# Patient Record
Sex: Female | Born: 1975 | Race: White | Hispanic: No | Smoking: Never smoker
Health system: Southern US, Community
[De-identification: ages and names within clinical notes are randomized; demographics above are authoritative.]

## PROBLEM LIST (undated history)

## (undated) DIAGNOSIS — N200 Calculus of kidney: Secondary | ICD-10-CM

## (undated) DIAGNOSIS — M1990 Unspecified osteoarthritis, unspecified site: Secondary | ICD-10-CM

---

## 1999-09-30 ENCOUNTER — Other Ambulatory Visit: Admission: RE | Admit: 1999-09-30 | Discharge: 1999-09-30 | Payer: Self-pay | Admitting: Obstetrics & Gynecology

## 2001-03-07 ENCOUNTER — Other Ambulatory Visit: Admission: RE | Admit: 2001-03-07 | Discharge: 2001-03-07 | Payer: Self-pay | Admitting: Obstetrics & Gynecology

## 2002-04-27 ENCOUNTER — Other Ambulatory Visit: Admission: RE | Admit: 2002-04-27 | Discharge: 2002-04-27 | Payer: Self-pay | Admitting: Obstetrics & Gynecology

## 2003-06-24 ENCOUNTER — Other Ambulatory Visit: Admission: RE | Admit: 2003-06-24 | Discharge: 2003-06-24 | Payer: Self-pay | Admitting: Obstetrics & Gynecology

## 2004-08-07 ENCOUNTER — Other Ambulatory Visit: Admission: RE | Admit: 2004-08-07 | Discharge: 2004-08-07 | Payer: Self-pay | Admitting: Obstetrics & Gynecology

## 2005-01-31 ENCOUNTER — Encounter (INDEPENDENT_AMBULATORY_CARE_PROVIDER_SITE_OTHER): Payer: Self-pay | Admitting: *Deleted

## 2005-01-31 ENCOUNTER — Ambulatory Visit (HOSPITAL_COMMUNITY): Admission: AD | Admit: 2005-01-31 | Discharge: 2005-01-31 | Payer: Self-pay | Admitting: Obstetrics & Gynecology

## 2005-02-04 ENCOUNTER — Inpatient Hospital Stay (HOSPITAL_COMMUNITY): Admission: AD | Admit: 2005-02-04 | Discharge: 2005-02-04 | Payer: Self-pay | Admitting: Obstetrics & Gynecology

## 2006-02-02 ENCOUNTER — Inpatient Hospital Stay (HOSPITAL_COMMUNITY): Admission: RE | Admit: 2006-02-02 | Discharge: 2006-02-05 | Payer: Self-pay | Admitting: Obstetrics & Gynecology

## 2009-03-12 ENCOUNTER — Inpatient Hospital Stay (HOSPITAL_COMMUNITY): Admission: AD | Admit: 2009-03-12 | Discharge: 2009-03-12 | Payer: Self-pay | Admitting: Obstetrics and Gynecology

## 2009-05-14 ENCOUNTER — Inpatient Hospital Stay (HOSPITAL_COMMUNITY): Admission: RE | Admit: 2009-05-14 | Discharge: 2009-05-16 | Payer: Self-pay | Admitting: Obstetrics & Gynecology

## 2010-09-15 LAB — CBC
HCT: 31.9 % — ABNORMAL LOW (ref 36.0–46.0)
Hemoglobin: 11 g/dL — ABNORMAL LOW (ref 12.0–15.0)
MCV: 97.1 fL (ref 78.0–100.0)
Platelets: 199 10*3/uL (ref 150–400)
RBC: 3.27 MIL/uL — ABNORMAL LOW (ref 3.87–5.11)
RBC: 4.09 MIL/uL (ref 3.87–5.11)
RDW: 13.1 % (ref 11.5–15.5)
WBC: 10.3 10*3/uL (ref 4.0–10.5)
WBC: 10.6 10*3/uL — ABNORMAL HIGH (ref 4.0–10.5)

## 2010-09-15 LAB — RPR: RPR Ser Ql: NONREACTIVE

## 2010-09-18 LAB — URINALYSIS, ROUTINE W REFLEX MICROSCOPIC
Glucose, UA: 100 mg/dL — AB
Leukocytes, UA: NEGATIVE
Protein, ur: 30 mg/dL — AB
Specific Gravity, Urine: 1.01 (ref 1.005–1.030)
Urobilinogen, UA: 2 mg/dL — ABNORMAL HIGH (ref 0.0–1.0)

## 2010-09-18 LAB — URINE MICROSCOPIC-ADD ON

## 2010-09-18 LAB — CBC
HCT: 34.7 % — ABNORMAL LOW (ref 36.0–46.0)
MCV: 97.1 fL (ref 78.0–100.0)
Platelets: 202 10*3/uL (ref 150–400)
RDW: 12.9 % (ref 11.5–15.5)

## 2010-09-29 ENCOUNTER — Other Ambulatory Visit: Payer: Self-pay | Admitting: Obstetrics & Gynecology

## 2010-10-30 NOTE — Discharge Summary (Signed)
[name], Cassidy [name]           ACCOUNT NO.:  [id number]   MEDICAL RECORD NO.:  [id number]          PATIENT TYPE:  INP   LOCATION:  9105                          FACILITY:  [facility]   PHYSICIAN:  Carrington Clamp, M.D. DATE OF BIRTH:  09-Sep-1975   DATE OF ADMISSION:  02/02/2006  DATE OF DISCHARGE:  02/05/2006                                 DISCHARGE SUMMARY   FINAL DIAGNOSIS:  Intrauterine pregnancy at 40-4/[redacted] weeks gestation,  induction of labor, cephalopelvic disproportion, and persistent occiput  posterior position.   PROCEDURE:  Primary low transverse cesarean section.  Surgeon Dr. Ilda Mori.  Assistant Dr. Hilbert Bible.  Complications none.   This 35 year old G2, P 0-0-1-0, presents at term for induction with a  favorable cervix at 40 and 4/[redacted] weeks gestation.  The patient's antepartum  course up to this point had been uncomplicated. Upon admission and  induction, the patient progressed to complete dilation but failed to succeed  in bringing the vertex into the pelvis during the second stage of labor.  The fetus was noted to be in the occiput posterior presentation. Despite  efforts to rotate the infant, the baby remained in this position.  At this  point, a discussion was held with the patient on this failure to descend and  persistent malpresentation and a decision was made to proceed with a  cesarean section.  She was taken to the operating room by Dr. Ilda Mori  on February 03, 2006, where a primary low transverse cesarean section was  performed with delivery of an 8 pounds 5 ounces female infant without Apgars  of 8 and 9.  The delivery went without complications.  The patient's  postoperative course was benign without any significant fevers.  She was  felt ready for discharge on postoperative day three and was sent home on a  regular diet, told to decrease her activities, told to continue her prenatal  vitamins.  She did want her little boy circumcised before discharge.  She was  given Darvocet N100 1-2 q.4-6h. as needed for pain.  She was told to  continue her prenatal vitamins.  She is to follow up in our office in four  weeks.  Labs on discharge revealed the patient had a hemoglobin of 12.1,  white blood cell count of 20.1, and platelets of 218,000.      Leilani Able, P.A.-C.      Carrington Clamp, M.D.  Electronically Signed    MB/MEDQ  D:  03/03/2006  T:  03/04/2006  Job:  [id number]

## 2010-10-30 NOTE — Op Note (Signed)
[name], [name]           ACCOUNT NO.:  [id number]   MEDICAL RECORD NO.:  [id number]          PATIENT TYPE:  INP   LOCATION:  9105                          FACILITY:  [facility]   PHYSICIAN:  Ilda Mori, M.D.   DATE OF BIRTH:  11-17-1975   DATE OF PROCEDURE:  02/03/2006  DATE OF DISCHARGE:                                 OPERATIVE REPORT   PREOPERATIVE DIAGNOSIS:  Cephalopelvic disproportion and persistent occiput  posterior position.   POSTOPERATIVE DIAGNOSIS:  Cephalopelvic disproportion and persistent occiput  posterior position.   PROCEDURE:  Primary low transverse cesarean section.   SURGEON:  Dr. Ilda Mori.   ASSISTANT:  Dr. Hilbert Bible.   ANESTHESIA:  Epidural.   ESTIMATED BLOOD LOSS:  500 mL.   FINDINGS:  Female infant, Apgar scores 8 and 9, birth weight 8 pounds 5  ounces.   SPECIMENS:  Placenta was sent for cord blood collection.   COMPLICATIONS:  There were no complications.   INDICATIONS:  This is a 34 year old primigravida female who was admitted for  induction with a favorable cervix at 40 weeks and 3 days gestation.  The  patient progressed to complete dilatation, but failed to succeed in bringing  the vertex into the pelvis during the second stage.  The fetus was noted to  be in a occiput posterior presentation at 9 cm and despite efforts to rotate  the infant, the baby remained in an occiput posterior position for the last  part of the first stage and during the second stage of labor.  Based upon  the failure of descent and persistent occiput posterior position, the  decision was made to proceed with cesarean section.   PROCEDURE:  The patient was taken to the operating room and the epidural  anesthesia that had been placed during labor was injected for surgical  anesthesia.  The abdomen is prepped and draped in sterile fashion.  The  bladder had previously been catheterized.  A low transverse incision was  made and carried down to the fascia  which was entered transversely.  The  rectus sheath was divided from the underlying rectus muscle.  The rectus  muscle was then divided in the midline.  The peritoneum was entered by blunt  dissection and the serosa was entered sharply.  The lower segment was then  incised and carried down to the amniotic sac, which was then opened.  The  infant was delivered without difficulty.  Cord bloods were obtained.  The  placenta was then delivered intact and given for cord blood collection.  The  uterus was bluntly curettaged, the lower segment was closed with two layers,  first a running interlocking #1 Vicryl suture, the second a running  imbricating #1 Vicryl suture.  The peritoneum was closed in the  midline with a running 3-0 Vicryl suture that occluded the rectus muscle.  The fascia was closed with running 0 Vicryl suture and the skin was closed  with staples.  The patient tolerated the procedure well and left the  operative in good condition.      Ilda Mori, M.D.  Electronically Signed  RK/MEDQ  D:  02/03/2006  T:  02/03/2006  Job:  [id number]

## 2010-10-30 NOTE — Op Note (Signed)
Cassidy [name], Cassidy [name]           ACCOUNT NO.:  [id number]   MEDICAL RECORD NO.:  [id number]          PATIENT TYPE:  MAT   LOCATION:  MATC                          FACILITY:  [facility]   PHYSICIAN:  Gerrit Friends. Aldona Bar, M.D.   DATE OF BIRTH:  1976/04/20   DATE OF PROCEDURE:  01/31/2005  DATE OF DISCHARGE:                                 OPERATIVE REPORT   PREOPERATIVE DIAGNOSES:  1.  Incomplete abortion.  2.  Blood type O+.   POSTOPERATIVE DIAGNOSES:  1.  Incomplete abortion.  2.  Blood type O+.  3.  Pathology pending   PROCEDURE:  Suction dilatation and curettage for evacuation of incomplete  abortion.   SURGEON:  Gerrit Friends. Aldona Bar, M.D.   ANESTHESIA:  Intravenous conscious sedation plus paracervical block with 1%  Xylocaine without epinephrine.   HISTORY:  This 35 year old gravida 2, para 0 had a missed abortion diagnosed  in the office and was treated medically.  Her husband called me at 6:00 a.m.  on the morning of January 31, 2005,  relating the patient having hemorrhage  with a passing out spell.  She was advised to come to [facility] which  she did.  In triage she was having significant vaginal bleeding, although  her hemoglobin was 12.  She was taken to the operating room for evacuation  of incomplete abortion.   DESCRIPTION OF PROCEDURE:  Once in the operating room, the patient was  prepped and draped having placed in the short Allen stirrups in the  lithotomy position.  Intravenous conscious sedation was administered.  A  speculum was placed in the vagina after bladder had been drained of clear  urine with a red rubber catheter in an in-and-out fashion, and a large  amount of clot and blood was evacuated with suction.  The cervix was  visualized and was dilated beyond a #25 Pratt dilator.  There was a large  amount of what appeared to be the products of conception or clot at cervical  os.  This was removed but appeared to be more clot and products conception.  Using a  #8 suction curette, the cavity was then thoroughly gently and  systematically evacuated of what appeared to be a large amount of products  of conception.  Thereafter using the medium standard curette, the cavity was  thoroughly gently and systematically curetted until felt to be clean.  At  the conclusion of procedure, the uterus was upper limits of normal size, and  bleeding was minimal.  All products conception were sent pathology and  appropriately labeled.  The patient's blood type by history is O positive.   The procedure at this point was felt to be complete, and the patient was  taken to recovery room.  She will be discharged home with instructions to  return the office in approximately one weeks' time or as needed. She will be  given prescriptions for doxycycline 100 mg to use twice daily for five days  and Anaprox DS one every 8 hours as needed for cramping.  Condition on  arrival recovery is satisfactory.      Gerrit Friends.  Aldona Bar, M.D.  Electronically Signed     RMW/MEDQ  D:  01/31/2005  T:  01/31/2005  Job:  [id number]

## 2013-12-18 ENCOUNTER — Other Ambulatory Visit: Payer: Self-pay

## 2013-12-20 LAB — CYTOLOGY - PAP

## 2014-05-08 ENCOUNTER — Ambulatory Visit: Payer: Self-pay | Admitting: Podiatry

## 2014-05-08 ENCOUNTER — Ambulatory Visit (INDEPENDENT_AMBULATORY_CARE_PROVIDER_SITE_OTHER): Payer: 59 | Admitting: Podiatry

## 2014-05-08 ENCOUNTER — Encounter: Payer: Self-pay | Admitting: Podiatry

## 2014-05-08 VITALS — BP 118/78 | HR 87 | Resp 16 | Ht 63.0 in | Wt 112.0 lb

## 2014-05-08 DIAGNOSIS — L03039 Cellulitis of unspecified toe: Secondary | ICD-10-CM

## 2014-05-08 DIAGNOSIS — M2010 Hallux valgus (acquired), unspecified foot: Secondary | ICD-10-CM

## 2014-05-08 DIAGNOSIS — L03012 Cellulitis of left finger: Secondary | ICD-10-CM

## 2014-05-08 NOTE — Progress Notes (Signed)
   Subjective:    Patient ID: Pincus BadderJennifer B Lobb, female    DOB: 10/22/1975, 38 y.o.   MRN: [id number]  HPI Comments: i have a sad toe on my left foot great toe. It does hurt and i choose to ignore it. Its been like this for 2 days. Its better. i have been using epsom salt and i trim my nails.      Review of Systems  HENT: Positive for sinus pressure.   Skin:       Change in nails Thick scars  Hematological: Bruises/bleeds easily.  All other systems reviewed and are negative.      Objective:   Physical Exam        Assessment & Plan:

## 2014-05-08 NOTE — Patient Instructions (Signed)

## 2014-05-08 NOTE — Progress Notes (Signed)
Subjective:     Patient ID: Cassidy BadderJennifer B [name], female   DOB: 05/13/1976, 38 y.o.   MRN: [id number]  HPI patient has a painful left hallux nail mostly lateral border but across the entire nail and has lost the distal two thirds and secondary to trauma. States it's been this way for about a year's become painful recently with drainage also has structural deformity around the first metatarsal head of both feet which can become painful   Review of Systems  All other systems reviewed and are negative.      Objective:   Physical Exam  Constitutional: She is oriented to person, place, and time.  Cardiovascular: Intact distal pulses.   Musculoskeletal: Normal range of motion.  Neurological: She is oriented to person, place, and time.  Skin: Skin is warm.  Nursing note and vitals reviewed.  neurovascular status intact muscle strength adequate and range of motion within normal limits. Digits are well-perfused well oriented 3 in no equinus condition was noted. I noted that the left hallux nail is damaged with only one third present and the lateral side shows drainage and is painful when pressed     Assessment:     Damage left hallux nail with paronychia like infection present of the localized nature    Plan:     H&P and condition discussed with patient. I've recommended removal of nail and cleaning out of the bed and allowing a new nail to regrow explaining it may not grow normally. Patient wants procedure and today I infiltrated 60 g like Marcaine mixture remove the lateral border removed proud flesh abscess tissue and then remove remaining nail and applied sterile dressing. Reappoint for us to recheck again if nail should grow out abnormally

## 2014-06-14 HISTORY — PX: OTHER SURGICAL HISTORY: SHX169

## 2014-08-22 ENCOUNTER — Ambulatory Visit (INDEPENDENT_AMBULATORY_CARE_PROVIDER_SITE_OTHER): Payer: 59 | Admitting: Podiatry

## 2014-08-22 ENCOUNTER — Ambulatory Visit (INDEPENDENT_AMBULATORY_CARE_PROVIDER_SITE_OTHER): Payer: 59

## 2014-08-22 ENCOUNTER — Encounter: Payer: Self-pay | Admitting: Podiatry

## 2014-08-22 VITALS — BP 129/85 | HR 88 | Resp 16

## 2014-08-22 DIAGNOSIS — Q786 Multiple congenital exostoses: Secondary | ICD-10-CM

## 2014-08-22 DIAGNOSIS — L600 Ingrowing nail: Secondary | ICD-10-CM | POA: Diagnosis not present

## 2014-08-22 DIAGNOSIS — M79675 Pain in left toe(s): Secondary | ICD-10-CM

## 2014-08-22 DIAGNOSIS — M898X0 Other specified disorders of bone, multiple sites: Secondary | ICD-10-CM

## 2014-08-22 NOTE — Progress Notes (Signed)
Subjective:     Patient ID: Cassidy [name], female   DOB: 18-Nov-1975, 39 y.o.   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 At one point may require exostectomy or permanent nail removal and I educated her on both of those options

## 2015-02-18 ENCOUNTER — Other Ambulatory Visit: Payer: Self-pay | Admitting: Urology

## 2015-02-20 ENCOUNTER — Encounter (HOSPITAL_COMMUNITY): Payer: Self-pay | Admitting: *Deleted

## 2015-02-24 ENCOUNTER — Ambulatory Visit (HOSPITAL_COMMUNITY)
Admission: RE | Admit: 2015-02-24 | Discharge: 2015-02-24 | Disposition: A | Discharge status: Home / Self Care | Payer: Commercial Managed Care - HMO | Source: Ambulatory Visit | Attending: Urology | Admitting: Urology

## 2015-02-24 ENCOUNTER — Ambulatory Visit (HOSPITAL_COMMUNITY): Payer: Commercial Managed Care - HMO

## 2015-02-24 ENCOUNTER — Encounter (HOSPITAL_COMMUNITY): Payer: Self-pay | Admitting: *Deleted

## 2015-02-24 ENCOUNTER — Encounter (HOSPITAL_COMMUNITY)
Admission: RE | Disposition: A | Discharge status: Home / Self Care | Payer: Self-pay | Source: Ambulatory Visit | Attending: Urology

## 2015-02-24 DIAGNOSIS — Z87442 Personal history of urinary calculi: Secondary | ICD-10-CM | POA: Diagnosis not present

## 2015-02-24 DIAGNOSIS — Z7951 Long term (current) use of inhaled steroids: Secondary | ICD-10-CM | POA: Diagnosis not present

## 2015-02-24 DIAGNOSIS — Z793 Long term (current) use of hormonal contraceptives: Secondary | ICD-10-CM | POA: Diagnosis not present

## 2015-02-24 DIAGNOSIS — M7071 Other bursitis of hip, right hip: Secondary | ICD-10-CM | POA: Insufficient documentation

## 2015-02-24 DIAGNOSIS — M1990 Unspecified osteoarthritis, unspecified site: Secondary | ICD-10-CM | POA: Diagnosis not present

## 2015-02-24 DIAGNOSIS — N201 Calculus of ureter: Secondary | ICD-10-CM

## 2015-02-24 DIAGNOSIS — M7072 Other bursitis of hip, left hip: Secondary | ICD-10-CM | POA: Insufficient documentation

## 2015-02-24 DIAGNOSIS — Z79899 Other long term (current) drug therapy: Secondary | ICD-10-CM | POA: Diagnosis not present

## 2015-02-24 DIAGNOSIS — R109 Unspecified abdominal pain: Secondary | ICD-10-CM | POA: Diagnosis present

## 2015-02-24 HISTORY — DX: Unspecified osteoarthritis, unspecified site: M19.90

## 2015-02-24 HISTORY — DX: Calculus of kidney: N20.0

## 2015-02-24 LAB — PREGNANCY, URINE: Preg Test, Ur: NEGATIVE

## 2015-02-24 SURGERY — LITHOTRIPSY, ESWL
Anesthesia: LOCAL | Laterality: Right

## 2015-02-24 MED ORDER — DIPHENHYDRAMINE HCL 25 MG PO CAPS
25.0000 mg | ORAL_CAPSULE | ORAL | Status: AC
  Administered 2015-02-24: 25 mg via ORAL
  Filled 2015-02-24: qty 1

## 2015-02-24 MED ORDER — DIAZEPAM 5 MG PO TABS
10.0000 mg | ORAL_TABLET | ORAL | Status: AC
  Administered 2015-02-24: 10 mg via ORAL
  Filled 2015-02-24: qty 2

## 2015-02-24 MED ORDER — SODIUM CHLORIDE 0.9 % IV SOLN
INTRAVENOUS | Status: DC
  Administered 2015-02-24: 10:00:00 via INTRAVENOUS

## 2015-02-24 MED ORDER — CIPROFLOXACIN HCL 500 MG PO TABS
500.0000 mg | ORAL_TABLET | ORAL | Status: AC
  Administered 2015-02-24: 500 mg via ORAL
  Filled 2015-02-24: qty 1

## 2015-02-24 NOTE — Discharge Instructions (Signed)
Conscious Sedation, Adult, Care After °Refer to this sheet in the next few weeks. These instructions provide you with information on caring for yourself after your procedure. Your health care provider may also give you more specific instructions. Your treatment has been planned according to current medical practices, but problems sometimes occur. Call your health care provider if you have any problems or questions after your procedure. °WHAT TO EXPECT AFTER THE PROCEDURE  °After your procedure: °· You may feel sleepy, clumsy, and have poor balance for several hours. °· Vomiting may occur if you eat too soon after the procedure. °HOME CARE INSTRUCTIONS °· Do not participate in any activities where you could become injured for at least 24 hours. Do not: °¨ Drive. °¨ Swim. °¨ Ride a bicycle. °¨ Operate heavy machinery. °¨ Cook. °¨ Use power tools. °¨ Climb ladders. °¨ Work from a high place. °· Do not make important decisions or sign legal documents until you are improved. °· If you vomit, drink water, juice, or soup when you can drink without vomiting. Make sure you have little or no nausea before eating solid foods. °· Only take over-the-counter or prescription medicines for pain, discomfort, or fever as directed by your health care provider. °· Make sure you and your family fully understand everything about the medicines given to you, including what side effects may occur. °· You should not drink alcohol, take sleeping pills, or take medicines that cause drowsiness for at least 24 hours. °· If you smoke, do not smoke without supervision. °· If you are feeling better, you may resume normal activities 24 hours after you were sedated. °· Keep all appointments with your health care provider. °SEEK MEDICAL CARE IF: °· Your skin is pale or bluish in color. °· You continue to feel nauseous or vomit. °· Your pain is getting worse and is not helped by medicine. °· You have bleeding or swelling. °· You are still sleepy or  feeling clumsy after 24 hours. °SEEK IMMEDIATE MEDICAL CARE IF: °· You develop a rash. °· You have difficulty breathing. °· You develop any type of allergic problem. °· You have a fever. °MAKE SURE YOU: °· Understand these instructions. °· Will watch your condition. °· Will get help right away if you are not doing well or get worse. °Document Released: 03/21/2013 Document Reviewed: 03/21/2013 °[employer]® Patient Information ©2015 [employer]. This information is not intended to replace advice given to you by your health care provider. Make sure you discuss any questions you have with your health care provider. °  °

## 2015-02-24 NOTE — H&P (Signed)
Urology Admission H&P  Chief Complaint: right flank pain  History of Present Illness: Cassidy [name] is a 38yo with a hx of nephrolithiasis who presents today for R ESWL. This is her second stone event.  Her first was during pregnancy. 2 weeks ago she developed severe, sharp, intermittent, nonradiating right flank pain. She was diagnosed with a 7mm right ureteral stone. She had a KUB today which shows a mid to distal right ureteral stone. She denies fevers/chill/sweats  Past Medical History  Diagnosis Date  . Renal stones   . Arthritis     bursitis in both hips   Past Surgical History  Procedure Laterality Date  . Cesarean section  2007,2010  . Bone spur   2016    toe on left foot     Home Medications:  Prescriptions prior to admission  Medication Sig Dispense Refill Last Dose  . fluticasone (FLONASE) 50 MCG/ACT nasal spray Place 1 spray into both nostrils daily.   02/15/2015  . Multiple Vitamin (MULTIVITAMIN WITH MINERALS) TABS tablet Take 1 tablet by mouth daily.   02/23/2015  . norethindrone-ethinyl estradiol (CYCLAFEM,ALYACEN) 0.5/0.75/1-35 MG-MCG tablet Take 1 tablet by mouth daily.   02/24/2015 at 0600   Allergies:  Allergies  Allergen Reactions  . Anesthetics, Amide Nausea And Vomiting  . Other Nausea And Vomiting    All narcotics and mycins " make me throw up"  . Sulfa Antibiotics     bloating  . Latex Rash    History reviewed. No pertinent family history. Social History:  reports that she has never smoked. She does not have any smokeless tobacco history on file. She reports that she drinks alcohol. She reports that she does not use illicit drugs.  Review of Systems  Genitourinary: Positive for dysuria, urgency and flank pain.  All other systems reviewed and are negative.   Physical Exam:  Vital signs in last 24 hours: Temp:  [98.4 F (36.9 C)] 98.4 F (36.9 C) (09/12 0813) Pulse Rate:  [83] 83 (09/12 0813) Resp:  [16] 16 (09/12 0813) BP: (116)/(65) 116/65 mmHg  (09/12 0813) SpO2:  [97 %] 97 % (09/12 0813) Weight:  [50.984 kg (112 lb 6.4 oz)] 50.984 kg (112 lb 6.4 oz) (09/12 0813) Physical Exam  Constitutional: She is oriented to person, place, and time. She appears well-developed and well-nourished.  HENT:  Head: Normocephalic and atraumatic.  Eyes: EOM are normal.  Neck: Normal range of motion. No thyromegaly present.  Cardiovascular: Normal rate and regular rhythm.   Respiratory: Effort normal. No respiratory distress.  GI: Soft. She exhibits no distension.  Musculoskeletal: Normal range of motion.  Neurological: She is alert and oriented to person, place, and time.  Skin: Skin is warm and dry.  Psychiatric: She has a normal mood and affect. Her behavior is normal. Judgment and thought content normal.    Laboratory Data:  Results for orders placed or performed during the hospital encounter of 02/24/15 (from the past 24 hour(s))  Pregnancy, urine     Status: None   Collection Time: 02/24/15  8:15 AM  Result Value Ref Range   Preg Test, Ur NEGATIVE NEGATIVE   No results found for this or any previous visit (from the past 240 hour(s)). Creatinine: No results for input(s): CREATININE in the last 168 hours. Baseline Creatinine: unknown  Impression/Assessment:  38yo with a mid to distal R ureteral stone  Plan:  The risks/benefits/alternatives to R ESWL was explained to the patient and she understands and wishes to proceed with  surgery.  Cassidy [name] L 02/24/2015, 9:43 AM

## 2018-10-13 ENCOUNTER — Other Ambulatory Visit: Payer: Self-pay | Admitting: Orthopedic Surgery

## 2018-10-13 DIAGNOSIS — M25511 Pain in right shoulder: Secondary | ICD-10-CM

## 2018-10-26 ENCOUNTER — Ambulatory Visit
Admission: RE | Admit: 2018-10-26 | Discharge: 2018-10-26 | Disposition: A | Discharge status: Home / Self Care | Payer: Commercial Managed Care - HMO | Source: Ambulatory Visit | Attending: Orthopedic Surgery | Admitting: Orthopedic Surgery

## 2018-10-26 ENCOUNTER — Other Ambulatory Visit: Payer: Self-pay

## 2018-10-26 DIAGNOSIS — M25511 Pain in right shoulder: Secondary | ICD-10-CM

## 2020-10-14 IMAGING — MR MRI OF THE RIGHT SHOULDER WITHOUT CONTRAST
5 series · 36 of 40 positions shown · non-contrast
Comparison: None.

CLINICAL DATA: Chronic progressive right shoulder pain for 8
months.

EXAM:
MRI OF THE RIGHT SHOULDER WITHOUT CONTRAST
TECHNIQUE: Multiplanar, multisequence MR imaging of the shoulder was performed.
No intravenous contrast was administered.

[Series 4: PD fat-sat · axial · 4.0mm · 0.55mm/px · z∈[-15,+66]mm · 8 of 18 slices shown (1 of 2)]
[im 1/18]
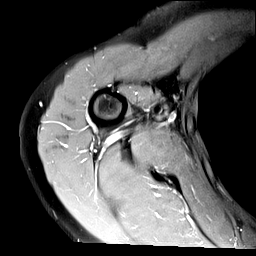
[im 3/18]
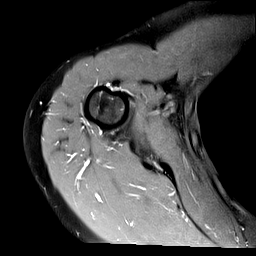
[im 5/18]
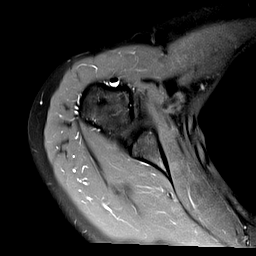
[im 8/18]
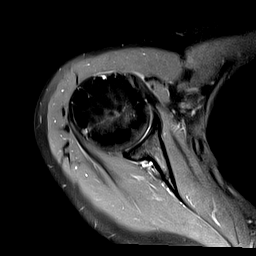
[im 10/18]
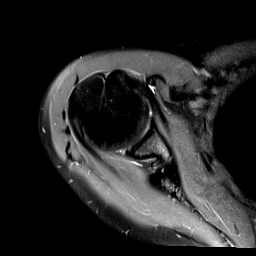
[im 13/18]
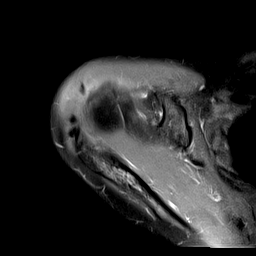
[im 15/18]
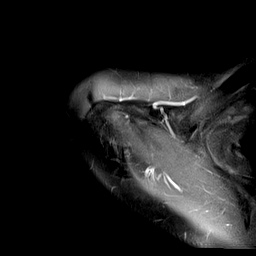
[im 18/18]
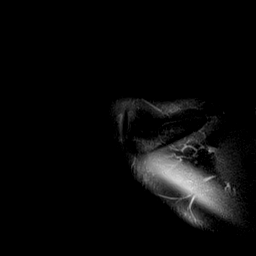

[Series 5: T2 fat-sat · oblique · 4.0mm · 0.55mm/px · 8 of 16 slices shown (1 of 2)]
[im 1/16]
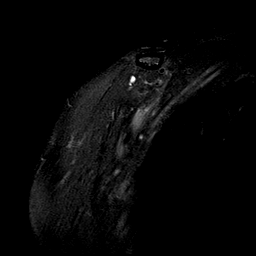
[im 3/16]
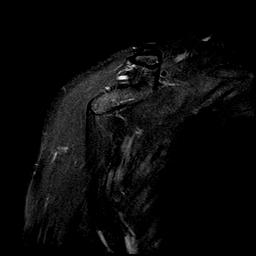
[im 5/16]
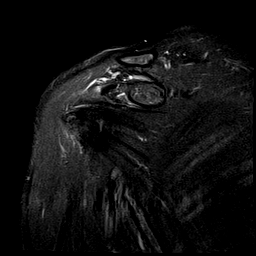
[im 7/16]
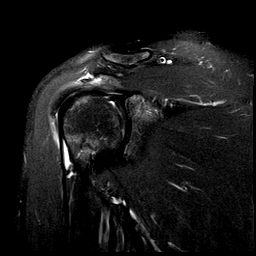
[im 9/16]
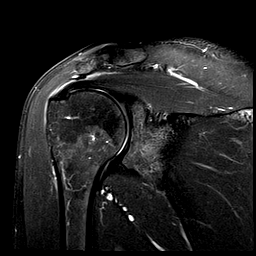
[im 11/16]
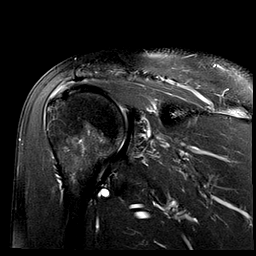
[im 13/16]
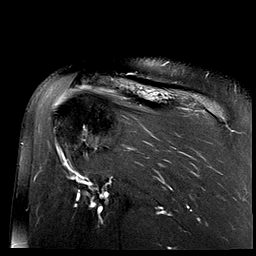
[im 16/16]
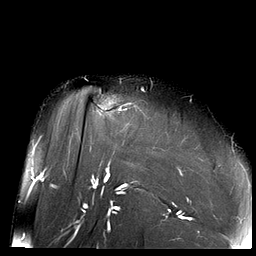

[Series 6: PD fat-sat · oblique · 4.0mm · 0.27mm/px · 8 of 16 slices shown (2 of 2)]
[im 1/16]
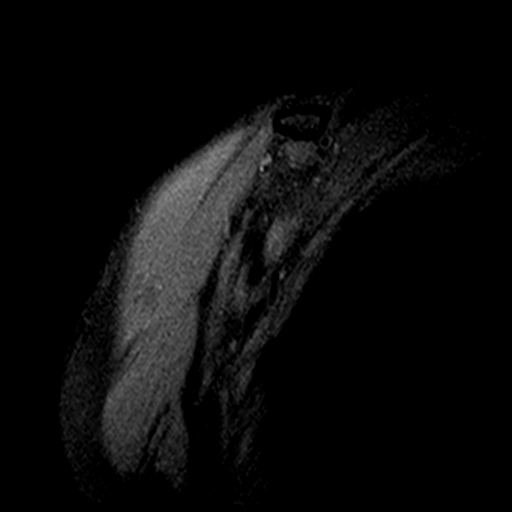
[im 3/16]
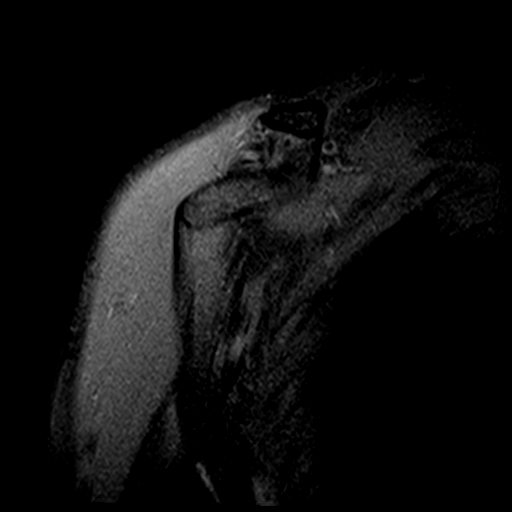
[im 5/16]
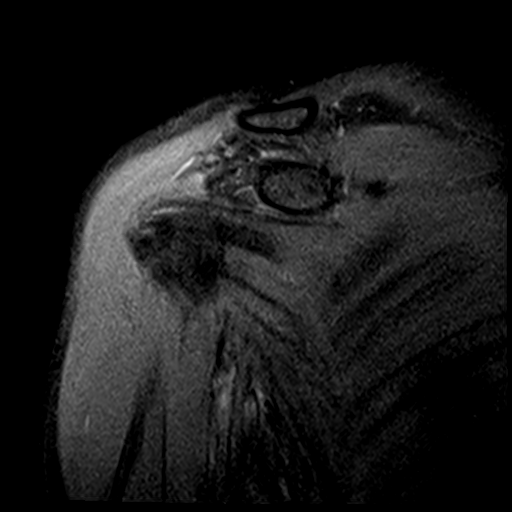
[im 7/16]
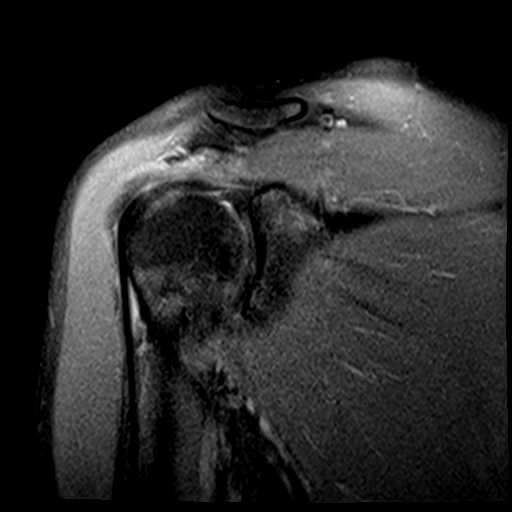
[im 9/16]
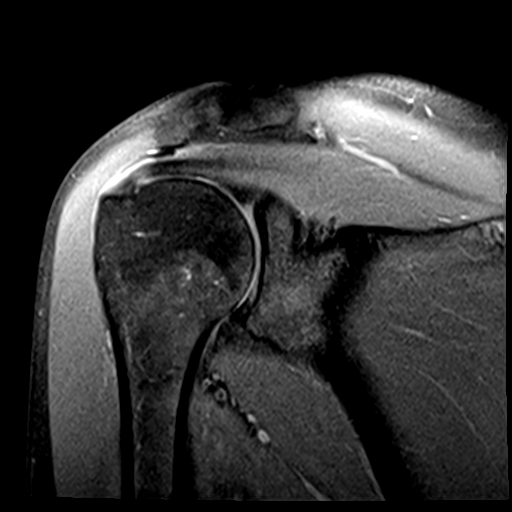
[im 11/16]
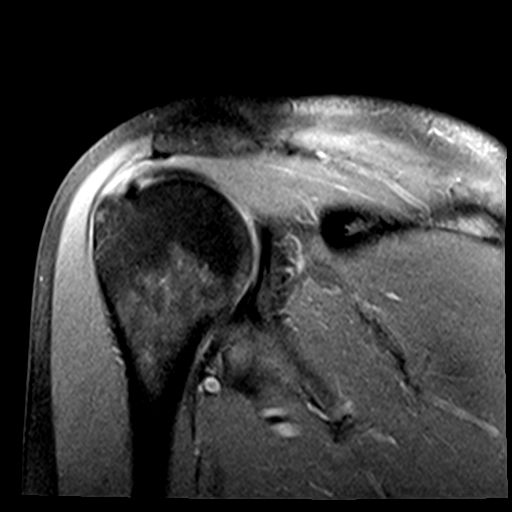
[im 13/16]
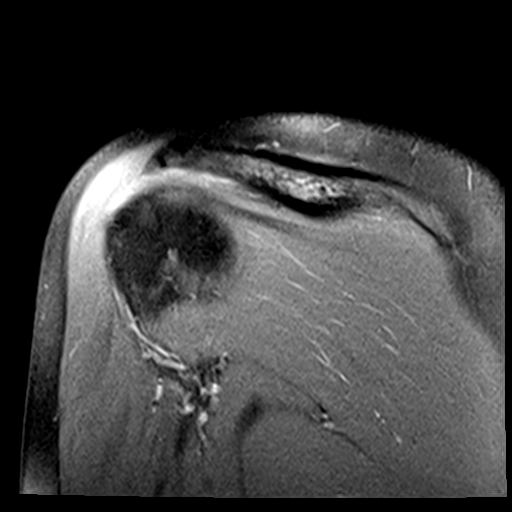
[im 16/16]
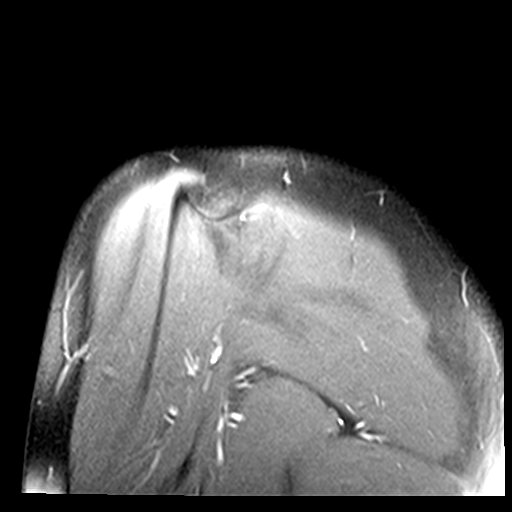

[Series 7: T2 fat-sat · coronal · 4.0mm · 0.55mm/px · 8 of 16 slices shown (2 of 2)]
[im 1/16]
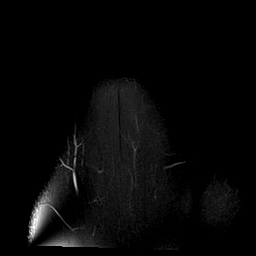
[im 3/16]
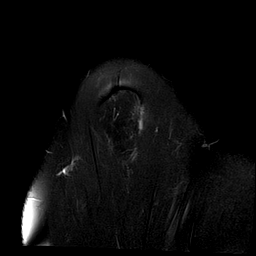
[im 5/16]
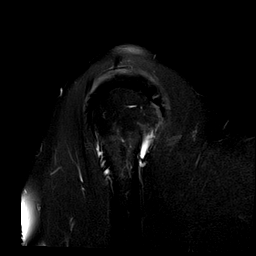
[im 7/16]
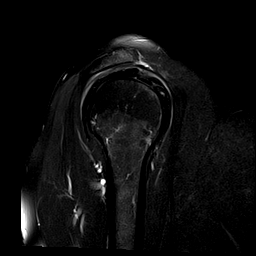
[im 9/16]
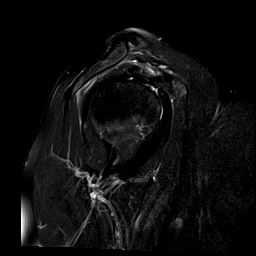
[im 11/16]
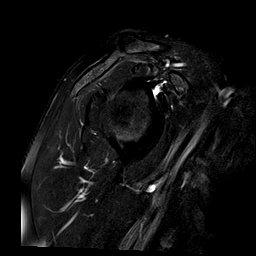
[im 13/16]
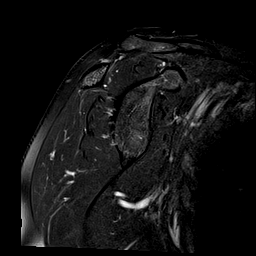
[im 16/16]
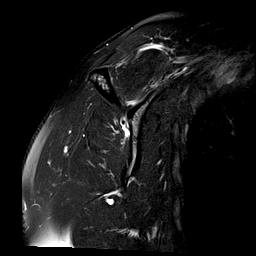

[Series 8: T1 · coronal · 4.0mm · 0.27mm/px · 4 of 16 slices shown]
[im 1/16]
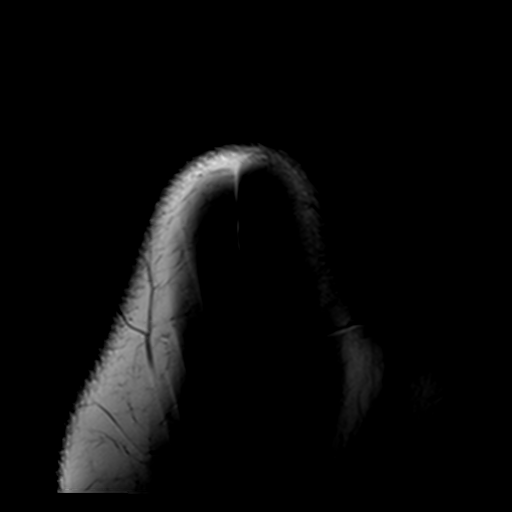
[im 3/16]
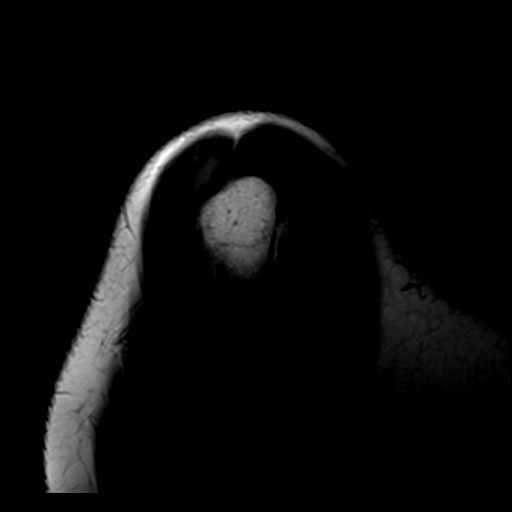
[im 5/16]
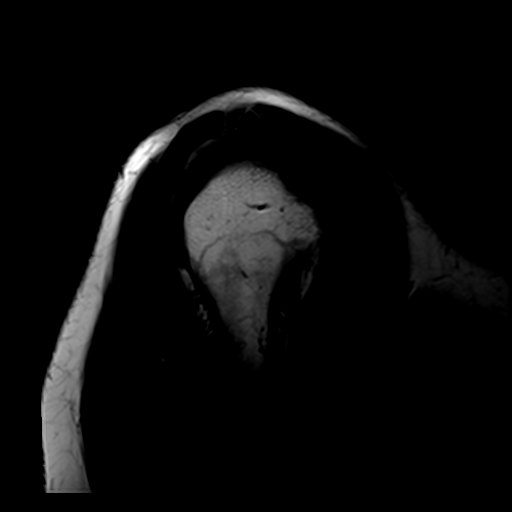
[im 7/16]
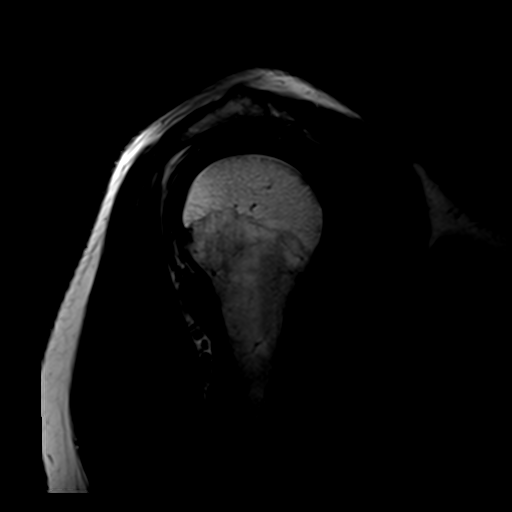

[36 of 40 positions shown; findings below may reference images not displayed]

FINDINGS: Rotator cuff:  Normal.

Muscles: No atrophy or abnormal signal of the muscles of the rotator
cuff.

Biceps long head:  Properly located and intact.

Acromioclavicular Joint:  Normal.  Type 1 acromion. No bursitis.

Glenohumeral Joint: No joint effusion. No chondral defect.

Labrum:  Normal.

Bones:  Normal.

Other: No adenopathy or other soft tissue abnormalities.
IMPRESSION: Normal MRI of the right shoulder.
